# Patient Record
Sex: Male | Born: 1955 | Race: White | Hispanic: No | Marital: Single
Health system: Midwestern US, Academic
[De-identification: ages and names within clinical notes are randomized; demographics above are authoritative.]

---

## 2016-09-24 ENCOUNTER — Encounter: Admit: 2016-09-24 | Discharge: 2016-09-24 | Payer: MEDICAID

## 2016-09-24 ENCOUNTER — Ambulatory Visit: Admit: 2016-09-24 | Discharge: 2016-09-25 | Payer: MEDICAID

## 2016-09-24 DIAGNOSIS — G629 Polyneuropathy, unspecified: ICD-10-CM

## 2016-09-24 DIAGNOSIS — R51 Headache: ICD-10-CM

## 2016-09-24 DIAGNOSIS — R29898 Other symptoms and signs involving the musculoskeletal system: ICD-10-CM

## 2016-09-24 DIAGNOSIS — G259 Extrapyramidal and movement disorder, unspecified: ICD-10-CM

## 2016-09-24 DIAGNOSIS — E119 Type 2 diabetes mellitus without complications: ICD-10-CM

## 2016-09-24 DIAGNOSIS — M792 Neuralgia and neuritis, unspecified: ICD-10-CM

## 2016-09-24 DIAGNOSIS — G479 Sleep disorder, unspecified: ICD-10-CM

## 2016-09-24 DIAGNOSIS — M1990 Unspecified osteoarthritis, unspecified site: ICD-10-CM

## 2016-09-24 DIAGNOSIS — G600 Hereditary motor and sensory neuropathy: Principal | ICD-10-CM

## 2016-09-24 NOTE — Progress Notes
SPEECH PATHOLOGY ENCOUNTER  DEPARTMENT OF NEUROLOGY    NAME: Darryl Wilcox    DOB: 12/31/1955   MRN: [id number]  DATE: 09/24/2016   CLINIC: [facility]    CHIEF COMPLAINT/RELEVANT HISTORY    History provided by: patient    Speech: Pt denies any changes to speech or communication.     Swallow: Pt denies changes to swallow function. He maintains a regular diet, drinks thin liquids and takes pills easily. He denies excess saliva or dry mouth symptoms. His weight is down 18 lbs since last visit. He attributes it to decreased appetite. We discussed options of Meals on Wheels, which he declined because "It's terrible in my area." We also discussed microwave meals as an option.  After I left the room, pt informed OT graduate clinicians, he sometimes goes all day without eating a meal.    EAT-10 score today: 0  (if the score is greater than 3 it may indicate swallowing problems).      EXAM FINDINGS    Speech:      Current method of communication: verbal    Speech is characterized by:  % intelligibility estimate: 100%    Situations when speech is difficult to understand: None noted    Patient report of others having difficulty hearing them: No    Hearing:  Any indication of hearing problems? DNT    Swallowing     Current method of intake: Oral    Forced Vital Capacity: See RT note    Current Diet: Regular, Thin Liquids    Body Weight Status: losing weight (-18 lbs since last visit)    Coughing/Choking: no choking reported, no coughing reported    Other Difficulties Noted: none noted    CLINICAL IMPRESSIONS    Pt presents with no dysarthria and no dysphagia.    PLAN OF CARE    Speech  Continue through [facility]    Hearing  No recommendations    Swallowing  Diet regular diet and thin liquids, good oral hygiene and follow-up through [facility].    Therapist: Mardella LaymanLindsey Jowana Thumma  Date: 09/24/2016

## 2016-09-24 NOTE — Progress Notes
[facility] OCCUPATIONAL THERAPY  PROGRESS NOTE    NAME: Bettye BoeckKevin L Baltes DOB: 08/25/1955 MRN: [id number]  DATE: 09/24/2016  DISABILILTY: Charcot Marie Tooth     ONSET: -    S: Patient seen in [facility] this date with patient    O: ADL STATUS     Feeding: Modified Independent    Grooming: Modified Independent    Bathing: Modified Independent    UE Dressing: Modified Independent    Toileting: Modified Independent    Able to do: buttons, zippers, tie shoes    Increased time required: Yes    Equipment/Other: shower chair and grab bar(s)    MOBILITY    Walk: 150+ feet with 4-wheeled walker    Wheelchair: Psychiatric nursecooter- has owned for 3 years, but has never used     Bed: regular    Other: home modifications- ramp for front entrance into home    Transfers: stand-pivot    UE STATUS     Hand Function: adequate to complete self care needs    Home Program: Day-to-day activities    Communication: Able to communicate wants and needs independently    Did not discuss energy conservation/work simplification     A/P: Mr. Delane GingerGill was seen in clinic this date with Speech Language Pathologist. He reports that he is currently able to perform all self-care activities on his own. He states that he has not noticed any decline in his mobility. Mr. Delane GingerGill did state that he has not had an appetite recently as "food does not taste good". Meals on Wheels was dismissed as he felt their food did not taste good. He did report that he has been attempting making microwave meals and that helps, but there are still days where he does not eat at all. Overall, Mr. Delane GingerGill denies any occupational therapy concerns at this time. Report of not eating verbalized when Speech Language Pathologist had left the room. Notified Warehouse managerpeech Language Pathologist.     OT will continue to follow.    Therapist: Juanda BondAlicia Pille, OTS; Linus SalmonsErin Furrow, OTS; Vonzella NippleKelli Reiling, OTD, OTR/L, MSCS  Date: 09/24/2016

## 2016-09-24 NOTE — Progress Notes
Date of Service: 09/24/2016    Subjective:             Darryl Wilcox is a 61 y.o. man with CMT, returning for follow up in the [facility] neuromuscular multidisciplinary clinic.       History of Present Illness  He has CMT, unknown subtype, diagnosis made with EMG/NCS around 2005 in [place]. He previously followed with Dr Louanne Skye for several years.    His last visit was 03/26/16.    He has bilateral foot drop.  His walking is affected by arthritis as well.  He is walking slow.  No falls since stopping pravastatin (which he says was causing syncope).  He uses a rolling walker or cane at all times.  Very antalgic gait due to pain in the knees.  He has AFOs at home but does not ever wear them because they are uncomfortable and he cannot drive in them.     He is following with Dr Maisie Fus (PCP) about every 6 months.  He has been treating his arthritic pain.  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  He continues oxycodone IR.    At last visit he was reporting poor sleep.  I had referred him for a sleep study.  He did not have that done.  He has been using benadryl recently and it has helped him get more sleep.      He has lost some weight, almost 18 pounds since last visit.  He reports no appetite.    He denies problems with hand dexterity.  No problems with buttons or zippers.  Independent in all ADLs.      [employer]ae comprehensive hereditary neuropathy panel showed no pathogenic variants.      sh: lives alone, does not work (on disability), has a Dietitian of friends, denies depression         Review of Systems   Constitutional: Positive for activity change, appetite change and fatigue.   HENT: Positive for rhinorrhea and sinus pressure.    Musculoskeletal: Positive for arthralgias, back pain, gait problem and myalgias.   Skin: Positive for wound.   Neurological: Positive for tremors, weakness and headaches.   Hematological: Bruises/bleeds easily.   All other systems reviewed and are negative. Objective:         ??? Aspirin 81 mg Tab Take  by mouth daily.     ??? Diltiazem HCl 360 mg Tb24 Take  by mouth. ER    ??? DULoxetine DR (CYMBALTA) 30 mg capsule Take 30 mg by mouth daily.   ??? lisinopril (PRINIVIL, ZESTRIL) 40 mg tablet Take 40 mg by mouth.     ??? metoprolol XL (TOPROL XL) 200 mg tablet Take  by mouth daily. 1/2 tab    ??? oxycodone(+) (ROXICODONE, OXY-IR) 10 mg tablet Take 10 mg by mouth every 8 hours as needed for Pain   ??? pantoprazole DR (PROTONIX) 40 mg tablet Take 40 mg by mouth daily.   ??? pioglitazone-metformin (ACTOPLUS MET) 15-500 mg tablet Take 1 Tab by mouth daily.       Vitals:    09/24/16 1350   Weight: 119.7 kg (263 lb 12.8 oz)   Height: 174 cm (68.5)     Body mass index is 39.53 kg/m???.     Physical Exam            On examination today, he appears well-nourished and in no distress. He is alert and fully oriented. Language is normal for  expression and comprehension. Speech is normal.  ??????  Pupils reactive to light. Extraocular movements are normal. Facial musculature is strong and symmetric except for right orbicularis oculi which is 4/5 (result of past injury). ???He is unable to close the right eye completely. ???Palpebral fissure on the right is about 60% that of the left. ???The palate moves normally. The tongue is of normal bulk and strength.  There are no tongue fasciculations.  ??????  ??? ??? ??? ??? ??? ???   Neck flexors:?????? 5 ??? ??? ??? ???   Neck extensors:?????? 5 ??? ??? ??? ???   ?????? Right?????? Left?????? ?????? Right?????? Left??????   Shoulder abductors:?????? 4 4 Hip flexors:?????? 4 4   Elbow flexors:?????? 5??? 5 ??? Hip abductors:?????? 5 5   Elbow extensors:?????? 5??? 5 ??? Hip extensors:?????? ?????? ??????   Wrist extensors:?????? 5 ??? 5 ??? Hip adductors:?????? 5 5   Wrist flexors:?????? 5 ??? 5 ??? Knee flexors:?????? 5 5   Finger flexors:?????? 5 ??? 5 ??? Knee extensors:?????? 5 (crepitus) 5 (crepitus)   Finger extensors:?????? 5 ??? 5 ??? Ankle plantar flexors:?????? 0??? 0 ???   Finger abductors:?????? 5 ??? 5 ??? Ankle dorsiflexors:?????? 1 0   Thumb abductors:?????? ?????? ?????? Ankle inversion:?????? ?????? ?????? ?????? ?????? ?????? Ankle eversion:?????? ?????? ??????   ?????? ?????? ?????? Toe extensors:?????? 0 0   ?????? ?????? ?????? Toe flexors: ??? 0 0   ???  ??????  Muscle tone is decreased in the distal lower extremities. He has atrophy below the knees. ???He has action tremor of the upper extremities, right more than left. Fasciculations are not seen.  ??????  Deep tendon reflexes are absent at the knees and ankles, 2s in the biceps, triceps, and brachioradialis bilaterally.  ??????  Coordination is normal.   ??????  He ambulates with a 4-wheel walker, steppage gait due to bilateral foot drop, antalgic gait due to arthritic pain.??? He has to push with arms to stand from a seated position. ???  ???      Assessment and Plan:  Darryl Wilcox is seen today in follow up of CMT, unknown subtype.  Invitae comprehensive neuropathies panel was negative.  He has severe bilateral foot drop, but has not tolerated AFOs.  He recent denies falls.  He uses a 4WW for most longer distances.  He is seen today in the multidisciplinary clinic.  He also reports reduced appetite and discusses this with speech/diet today.  He has no further concerns.  Will continue to monitor for equipment/medical needs.        Return in about 6 months (around 03/27/2017).    Darryl Wilcox was seen today for follow up.    Diagnoses and all orders for this visit:    Charcot-Marie-Tooth disease                          Time spent with the patient was 25 minutes, 15 minutes of which were towards counseling regarding diagnosis, prognosis and management.

## 2016-09-25 ENCOUNTER — Encounter: Admit: 2016-09-25 | Discharge: 2016-09-25 | Payer: MEDICAID

## 2016-09-25 DIAGNOSIS — G600 Hereditary motor and sensory neuropathy: Principal | ICD-10-CM

## 2016-10-21 ENCOUNTER — Encounter: Admit: 2016-10-21 | Discharge: 2016-10-21 | Payer: MEDICAID

## 2016-10-21 DIAGNOSIS — M1990 Unspecified osteoarthritis, unspecified site: ICD-10-CM

## 2016-10-21 DIAGNOSIS — R29898 Other symptoms and signs involving the musculoskeletal system: ICD-10-CM

## 2016-10-21 DIAGNOSIS — E119 Type 2 diabetes mellitus without complications: ICD-10-CM

## 2016-10-21 DIAGNOSIS — R51 Headache: ICD-10-CM

## 2016-10-21 DIAGNOSIS — G629 Polyneuropathy, unspecified: ICD-10-CM

## 2016-10-21 DIAGNOSIS — M792 Neuralgia and neuritis, unspecified: ICD-10-CM

## 2016-10-21 DIAGNOSIS — G259 Extrapyramidal and movement disorder, unspecified: ICD-10-CM

## 2016-10-21 DIAGNOSIS — G479 Sleep disorder, unspecified: ICD-10-CM

## 2016-10-21 DIAGNOSIS — G600 Hereditary motor and sensory neuropathy: Principal | ICD-10-CM

## 2017-03-20 ENCOUNTER — Encounter: Admit: 2017-03-20 | Discharge: 2017-03-20 | Payer: MEDICAID

## 2017-03-25 ENCOUNTER — Encounter: Admit: 2017-03-25 | Discharge: 2017-03-25 | Payer: MEDICAID

## 2017-03-25 ENCOUNTER — Ambulatory Visit: Admit: 2017-03-25 | Discharge: 2017-03-26 | Payer: MEDICAID

## 2017-03-25 DIAGNOSIS — M1990 Unspecified osteoarthritis, unspecified site: ICD-10-CM

## 2017-03-25 DIAGNOSIS — R29898 Other symptoms and signs involving the musculoskeletal system: ICD-10-CM

## 2017-03-25 DIAGNOSIS — R51 Headache: ICD-10-CM

## 2017-03-25 DIAGNOSIS — G629 Polyneuropathy, unspecified: ICD-10-CM

## 2017-03-25 DIAGNOSIS — M792 Neuralgia and neuritis, unspecified: ICD-10-CM

## 2017-03-25 DIAGNOSIS — G259 Extrapyramidal and movement disorder, unspecified: ICD-10-CM

## 2017-03-25 DIAGNOSIS — G479 Sleep disorder, unspecified: ICD-10-CM

## 2017-03-25 DIAGNOSIS — E119 Type 2 diabetes mellitus without complications: ICD-10-CM

## 2017-03-25 DIAGNOSIS — G600 Hereditary motor and sensory neuropathy: Principal | ICD-10-CM

## 2017-03-26 DIAGNOSIS — G600 Hereditary motor and sensory neuropathy: Principal | ICD-10-CM

## 2017-09-23 ENCOUNTER — Encounter: Admit: 2017-09-23 | Discharge: 2017-09-23 | Payer: MEDICAID

## 2017-09-23 ENCOUNTER — Ambulatory Visit: Admit: 2017-09-23 | Discharge: 2017-09-24 | Payer: MEDICAID

## 2017-09-23 DIAGNOSIS — G629 Polyneuropathy, unspecified: ICD-10-CM

## 2017-09-23 DIAGNOSIS — G259 Extrapyramidal and movement disorder, unspecified: ICD-10-CM

## 2017-09-23 DIAGNOSIS — R29898 Other symptoms and signs involving the musculoskeletal system: ICD-10-CM

## 2017-09-23 DIAGNOSIS — M1990 Unspecified osteoarthritis, unspecified site: ICD-10-CM

## 2017-09-23 DIAGNOSIS — M792 Neuralgia and neuritis, unspecified: ICD-10-CM

## 2017-09-23 DIAGNOSIS — G600 Hereditary motor and sensory neuropathy: Principal | ICD-10-CM

## 2017-09-23 DIAGNOSIS — G479 Sleep disorder, unspecified: ICD-10-CM

## 2017-09-23 DIAGNOSIS — E119 Type 2 diabetes mellitus without complications: ICD-10-CM

## 2017-09-23 DIAGNOSIS — R51 Headache: ICD-10-CM

## 2017-09-24 ENCOUNTER — Encounter: Admit: 2017-09-24 | Discharge: 2017-09-24 | Payer: MEDICAID

## 2017-09-24 DIAGNOSIS — G629 Polyneuropathy, unspecified: Principal | ICD-10-CM

## 2017-09-24 DIAGNOSIS — R7982 Elevated C-reactive protein (CRP): ICD-10-CM

## 2017-09-24 LAB — COMPREHENSIVE METABOLIC PANEL
Lab: 11 U/L (ref 7–40)
Lab: 132 MMOL/L — ABNORMAL LOW (ref 137–147)
Lab: 25 MMOL/L (ref 21–30)
Lab: 4.1 g/dL (ref 3.5–5.0)
Lab: 40 mL/min — ABNORMAL LOW (ref >60)
Lab: 48 mL/min — ABNORMAL LOW (ref >60)
Lab: 7 U/L (ref 7–56)
Lab: 99 U/L (ref 25–110)
Lab: 7 (ref 3–12)

## 2017-09-24 LAB — IMMUNOFIXATION, SERUM (IFES)

## 2017-09-24 LAB — TSH WITH FREE T4 REFLEX: Lab: 2 uU/mL — ABNORMAL LOW (ref 0.35–5.00)

## 2017-09-24 LAB — CREATINE KINASE-CPK: Lab: 124 U/L (ref 35–232)

## 2017-09-24 LAB — VITAMIN B12: Lab: 212 pg/mL — ABNORMAL HIGH (ref 180–914)

## 2017-09-24 LAB — ELECTROPHORESIS-SERUM PROTEIN: Lab: 7.3 g/dL — ABNORMAL HIGH (ref 6.0–8.0)

## 2017-09-24 LAB — ANTI-NUCLEAR ANTIBODY(ANA): Lab: <80 {titer} — ABNORMAL HIGH (ref <80)

## 2017-09-24 LAB — RHEUMATOID FACTOR (RF): Lab: 10 [IU]/mL (ref <14)

## 2017-09-26 ENCOUNTER — Encounter: Admit: 2017-09-26 | Discharge: 2017-09-26 | Payer: MEDICAID

## 2017-10-07 ENCOUNTER — Ambulatory Visit: Admit: 2017-10-07 | Discharge: 2017-10-08 | Payer: MEDICAID

## 2017-10-08 DIAGNOSIS — G600 Hereditary motor and sensory neuropathy: Principal | ICD-10-CM

## 2017-10-08 DIAGNOSIS — G5601 Carpal tunnel syndrome, right upper limb: ICD-10-CM

## 2017-12-24 ENCOUNTER — Encounter: Admit: 2017-12-24 | Discharge: 2017-12-24 | Payer: MEDICAID

## 2018-01-28 ENCOUNTER — Encounter: Admit: 2018-01-28 | Discharge: 2018-01-28 | Payer: MEDICAID

## 2018-01-28 DIAGNOSIS — G600 Hereditary motor and sensory neuropathy: Principal | ICD-10-CM

## 2018-02-02 ENCOUNTER — Encounter: Admit: 2018-02-02 | Discharge: 2018-02-02 | Payer: MEDICAID

## 2018-02-03 ENCOUNTER — Encounter: Admit: 2018-02-03 | Discharge: 2018-02-03 | Payer: MEDICAID

## 2018-03-18 ENCOUNTER — Encounter: Admit: 2018-03-18 | Discharge: 2018-03-18 | Payer: MEDICAID

## 2018-03-18 ENCOUNTER — Encounter: Admit: 2018-03-18 | Discharge: 2018-03-19 | Payer: MEDICAID

## 2018-03-24 ENCOUNTER — Ambulatory Visit: Admit: 2018-03-24 | Discharge: 2018-03-25 | Payer: MEDICAID

## 2018-03-24 ENCOUNTER — Encounter: Admit: 2018-03-24 | Discharge: 2018-03-24 | Payer: MEDICAID

## 2018-03-24 DIAGNOSIS — G629 Polyneuropathy, unspecified: Secondary | ICD-10-CM

## 2018-03-24 DIAGNOSIS — G479 Sleep disorder, unspecified: Secondary | ICD-10-CM

## 2018-03-24 DIAGNOSIS — G600 Hereditary motor and sensory neuropathy: Secondary | ICD-10-CM

## 2018-03-24 DIAGNOSIS — R29898 Other symptoms and signs involving the musculoskeletal system: Secondary | ICD-10-CM

## 2018-03-24 DIAGNOSIS — R51 Headache: Secondary | ICD-10-CM

## 2018-03-24 DIAGNOSIS — G7100 Muscular dystrophy, unspecified: Secondary | ICD-10-CM

## 2018-03-24 DIAGNOSIS — G259 Extrapyramidal and movement disorder, unspecified: Secondary | ICD-10-CM

## 2018-03-24 DIAGNOSIS — E119 Type 2 diabetes mellitus without complications: Secondary | ICD-10-CM

## 2018-03-24 DIAGNOSIS — M1990 Unspecified osteoarthritis, unspecified site: Secondary | ICD-10-CM

## 2018-03-24 DIAGNOSIS — M792 Neuralgia and neuritis, unspecified: Secondary | ICD-10-CM

## 2018-03-25 ENCOUNTER — Encounter: Admit: 2018-03-25 | Discharge: 2018-03-25 | Payer: MEDICAID

## 2018-03-25 DIAGNOSIS — G600 Hereditary motor and sensory neuropathy: Secondary | ICD-10-CM

## 2018-03-25 DIAGNOSIS — M792 Neuralgia and neuritis, unspecified: Secondary | ICD-10-CM

## 2018-03-25 DIAGNOSIS — G479 Sleep disorder, unspecified: Secondary | ICD-10-CM

## 2018-03-25 DIAGNOSIS — R51 Headache: Secondary | ICD-10-CM

## 2018-03-25 DIAGNOSIS — R52 Pain, unspecified: Secondary | ICD-10-CM

## 2018-03-25 DIAGNOSIS — R29898 Other symptoms and signs involving the musculoskeletal system: Secondary | ICD-10-CM

## 2018-03-25 DIAGNOSIS — G7100 Muscular dystrophy, unspecified: Secondary | ICD-10-CM

## 2018-03-25 DIAGNOSIS — G259 Extrapyramidal and movement disorder, unspecified: Secondary | ICD-10-CM

## 2018-03-25 DIAGNOSIS — E119 Type 2 diabetes mellitus without complications: Secondary | ICD-10-CM

## 2018-03-25 DIAGNOSIS — M1990 Unspecified osteoarthritis, unspecified site: Secondary | ICD-10-CM

## 2018-03-25 DIAGNOSIS — G629 Polyneuropathy, unspecified: Secondary | ICD-10-CM

## 2018-03-27 ENCOUNTER — Encounter: Admit: 2018-03-27 | Discharge: 2018-03-27 | Payer: MEDICAID

## 2018-06-15 ENCOUNTER — Encounter: Admit: 2018-06-15 | Discharge: 2018-06-15 | Payer: MEDICAID

## 2018-06-30 ENCOUNTER — Encounter: Admit: 2018-06-30 | Discharge: 2018-06-30 | Payer: MEDICAID

## 2018-07-14 ENCOUNTER — Encounter: Admit: 2018-07-14 | Discharge: 2018-07-14 | Payer: MEDICAID

## 2018-07-14 ENCOUNTER — Ambulatory Visit: Admit: 2018-07-14 | Discharge: 2018-07-15 | Payer: MEDICAID

## 2018-07-14 DIAGNOSIS — G7100 Muscular dystrophy, unspecified: ICD-10-CM

## 2018-07-14 DIAGNOSIS — G629 Polyneuropathy, unspecified: ICD-10-CM

## 2018-07-14 DIAGNOSIS — G479 Sleep disorder, unspecified: ICD-10-CM

## 2018-07-14 DIAGNOSIS — M1990 Unspecified osteoarthritis, unspecified site: ICD-10-CM

## 2018-07-14 DIAGNOSIS — G259 Extrapyramidal and movement disorder, unspecified: ICD-10-CM

## 2018-07-14 DIAGNOSIS — G600 Hereditary motor and sensory neuropathy: Principal | ICD-10-CM

## 2018-07-14 DIAGNOSIS — M792 Neuralgia and neuritis, unspecified: ICD-10-CM

## 2018-07-14 DIAGNOSIS — E119 Type 2 diabetes mellitus without complications: ICD-10-CM

## 2018-07-14 DIAGNOSIS — R51 Headache: ICD-10-CM

## 2018-07-14 DIAGNOSIS — R29898 Other symptoms and signs involving the musculoskeletal system: ICD-10-CM

## 2018-07-15 DIAGNOSIS — G600 Hereditary motor and sensory neuropathy: Principal | ICD-10-CM

## 2018-08-04 ENCOUNTER — Encounter: Admit: 2018-08-04 | Discharge: 2018-08-04

## 2019-02-23 ENCOUNTER — Ambulatory Visit: Admit: 2019-02-23 | Discharge: 2019-02-23 | Payer: MEDICAID

## 2019-02-23 ENCOUNTER — Encounter: Admit: 2019-02-23 | Discharge: 2019-02-23 | Payer: MEDICAID

## 2019-02-23 DIAGNOSIS — G629 Polyneuropathy, unspecified: Secondary | ICD-10-CM

## 2019-02-23 DIAGNOSIS — G600 Hereditary motor and sensory neuropathy: Secondary | ICD-10-CM

## 2019-02-23 DIAGNOSIS — M2560 Stiffness of unspecified joint, not elsewhere classified: Secondary | ICD-10-CM

## 2019-02-23 DIAGNOSIS — G479 Sleep disorder, unspecified: Secondary | ICD-10-CM

## 2019-02-23 DIAGNOSIS — E119 Type 2 diabetes mellitus without complications: Secondary | ICD-10-CM

## 2019-02-23 DIAGNOSIS — R519 Generalized headaches: Secondary | ICD-10-CM

## 2019-02-23 DIAGNOSIS — M1990 Unspecified osteoarthritis, unspecified site: Secondary | ICD-10-CM

## 2019-02-23 DIAGNOSIS — G259 Extrapyramidal and movement disorder, unspecified: Secondary | ICD-10-CM

## 2019-02-23 DIAGNOSIS — G7100 Muscular dystrophy, unspecified: Secondary | ICD-10-CM

## 2019-02-23 DIAGNOSIS — M792 Neuralgia and neuritis, unspecified: Secondary | ICD-10-CM

## 2019-06-21 NOTE — Telephone Encounter
Patient called to provide clinic with new cell number, RN updated number in chart. Patient also had questions about upcoming appointment. RN provided patient with details regarding clinic appointment on 6/15 @ 1:15 with Dr. Braxton Feathers.

## 2019-08-17 ENCOUNTER — Ambulatory Visit: Admit: 2019-08-17 | Discharge: 2019-08-18 | Payer: MEDICAID

## 2019-08-17 ENCOUNTER — Encounter: Admit: 2019-08-17 | Discharge: 2019-08-17 | Payer: MEDICAID

## 2019-08-17 DIAGNOSIS — G629 Polyneuropathy, unspecified: Secondary | ICD-10-CM

## 2019-08-17 DIAGNOSIS — G600 Hereditary motor and sensory neuropathy: Secondary | ICD-10-CM

## 2019-08-17 DIAGNOSIS — E119 Type 2 diabetes mellitus without complications: Secondary | ICD-10-CM

## 2019-08-17 DIAGNOSIS — M792 Neuralgia and neuritis, unspecified: Secondary | ICD-10-CM

## 2019-08-17 DIAGNOSIS — G479 Sleep disorder, unspecified: Secondary | ICD-10-CM

## 2019-08-17 DIAGNOSIS — G259 Extrapyramidal and movement disorder, unspecified: Secondary | ICD-10-CM

## 2019-08-17 DIAGNOSIS — M1990 Unspecified osteoarthritis, unspecified site: Secondary | ICD-10-CM

## 2019-08-17 DIAGNOSIS — G7100 Muscular dystrophy, unspecified: Secondary | ICD-10-CM

## 2019-08-17 DIAGNOSIS — R519 Generalized headaches: Secondary | ICD-10-CM

## 2019-08-17 DIAGNOSIS — M2560 Stiffness of unspecified joint, not elsewhere classified: Secondary | ICD-10-CM
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
## 2019-08-23 ENCOUNTER — Encounter: Admit: 2019-08-23 | Discharge: 2019-08-23 | Payer: MEDICAID

## 2019-08-23 NOTE — Progress Notes
New home health referral faxed to [facility] at [phone]. Fax was sent successfully per fax confirmation.

## 2020-02-10 ENCOUNTER — Encounter: Admit: 2020-02-10 | Discharge: 2020-02-10 | Payer: MEDICAID

## 2020-02-10 NOTE — Telephone Encounter
Pt LVM inquiring a call back. Pt was put into a Nursing home on Tuesday and he no longer will need any of his future apts scheduled. Routing to [employer] and lindsey as Building surveyor cancelled patient's future appointment.

## 2020-06-21 ENCOUNTER — Encounter: Admit: 2020-06-21 | Discharge: 2020-06-21 | Payer: MEDICAID

## 2020-10-04 ENCOUNTER — Encounter: Admit: 2020-10-04 | Discharge: 2020-10-04 | Payer: MEDICAID

## 2021-03-04 IMAGING — CR LOW_EXM
3 series · 3 of 3 positions shown · non-contrast
Comparison: none

[knee sunrise]
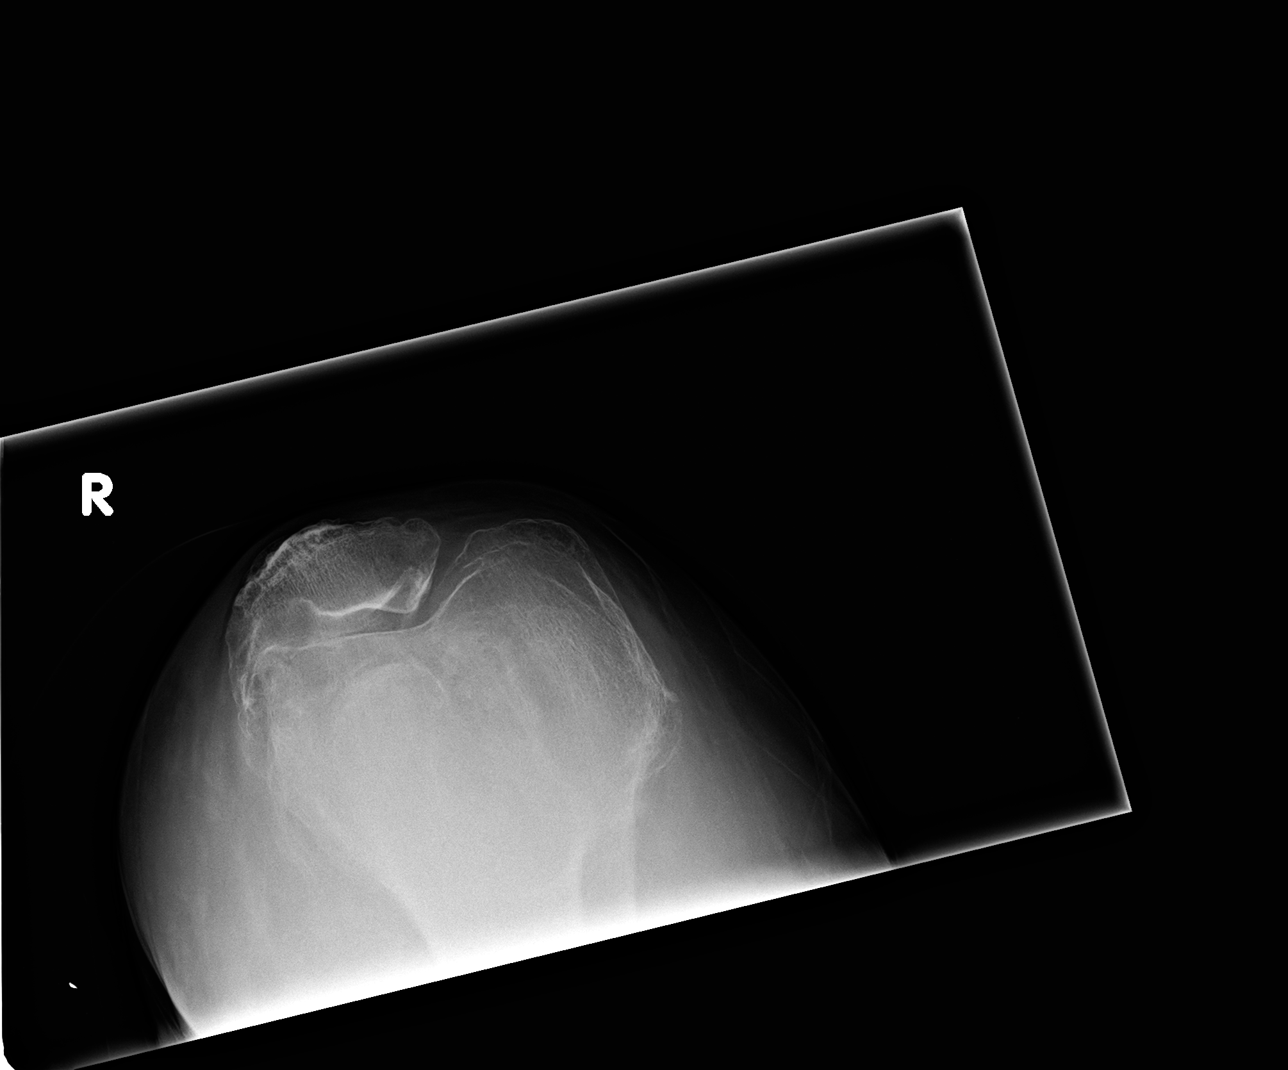

[knee lat]
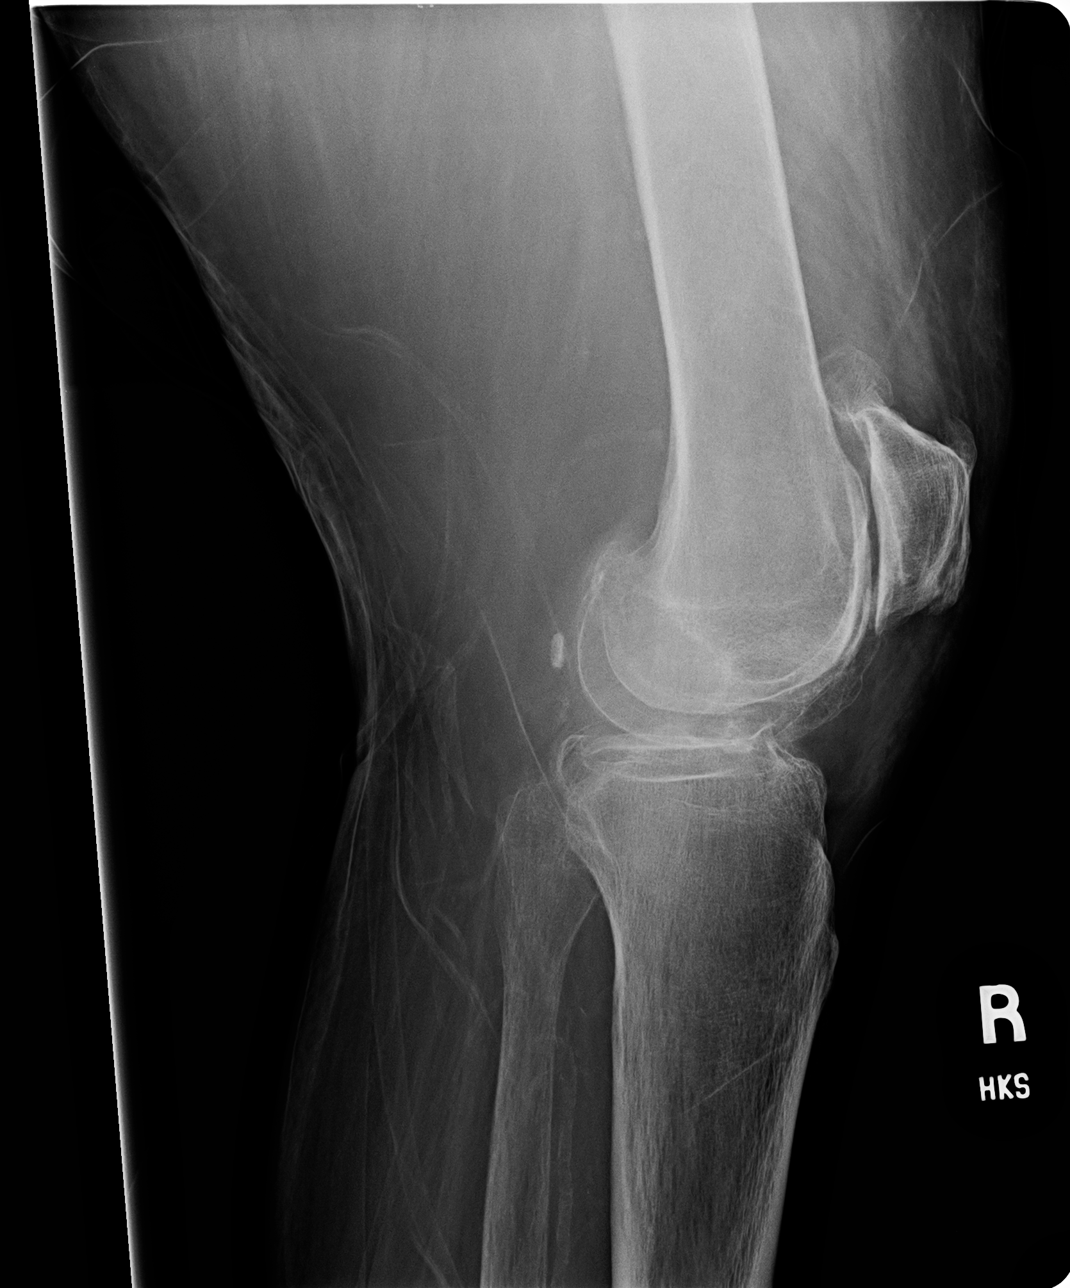

[knee ap]
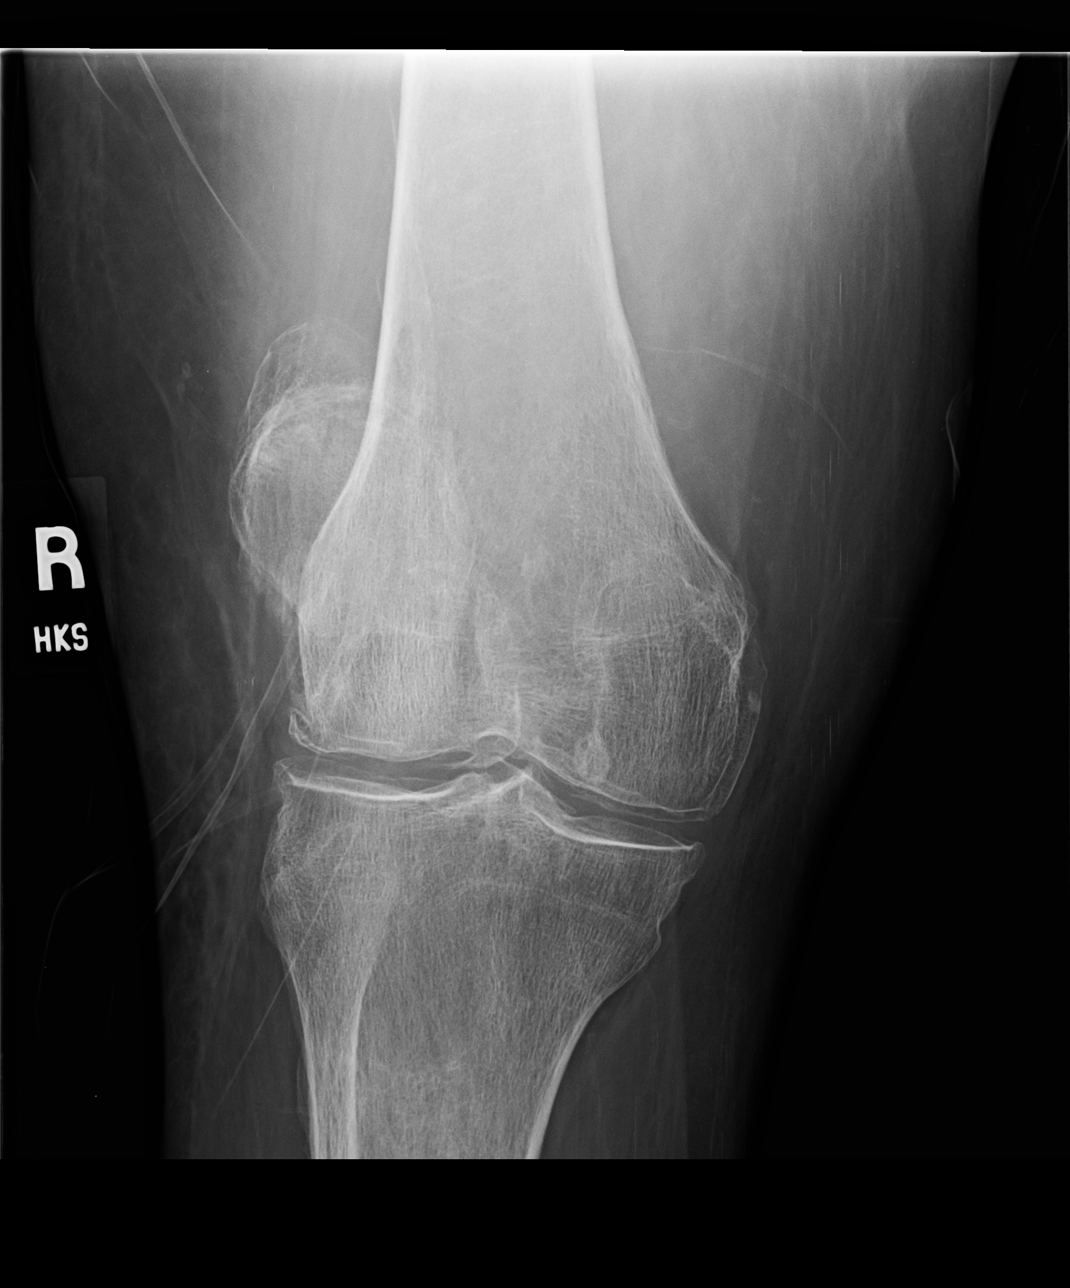

[3 of 3 positions shown; findings below may reference images not displayed]

DIAGNOSTIC STUDIES

EXAM

Bilateral knees.

INDICATION

knee pain
chronic bilateral knee pain. Pt states he has no cartilage on either knee and is about to get shots
in both knees.

TECHNIQUE

AP lateral patellar views of both knees were obtained.

COMPARISONS

March 18, 2018

FINDINGS

Left knee:
There is narrowing and spurring of the medial lateral compartments and patellofemoral joint space.
Chondrocalcinosis lateral meniscus is evident. No fractures are seen.

Right knee:
Spurring of the medial lateral compartments is also evident with chondrocalcinosis of the lateral
meniscus. There is marked narrowing of the patellofemoral joint space.

IMPRESSION

Tricompartment degenerative changes of both knees with chondrocalcinosis. This may reflect
underlying CPPD. No fractures are seen.

Tech Notes:

chronic bilateral knee pain. Pt states he has no cartilage on either knee and is about to get shots
in both knees.

## 2023-10-03 DEATH — deceased
